# Patient Record
Sex: Female | Born: 1960 | Race: Black or African American | Hispanic: No | Marital: Single | Smoking: Former smoker
Health system: Southern US, Community
[De-identification: ages and names within clinical notes are randomized; demographics above are authoritative.]

## PROBLEM LIST (undated history)

## (undated) HISTORY — PX: ABDOMINAL HYSTERECTOMY: SHX81

---

## 1998-04-13 ENCOUNTER — Emergency Department (HOSPITAL_COMMUNITY): Admission: EM | Admit: 1998-04-13 | Discharge: 1998-04-13 | Payer: Self-pay | Admitting: Emergency Medicine

## 1998-08-31 ENCOUNTER — Ambulatory Visit (HOSPITAL_BASED_OUTPATIENT_CLINIC_OR_DEPARTMENT_OTHER): Admission: RE | Admit: 1998-08-31 | Discharge: 1998-08-31 | Payer: Self-pay | Admitting: Orthopedic Surgery

## 1998-11-10 ENCOUNTER — Emergency Department (HOSPITAL_COMMUNITY): Admission: EM | Admit: 1998-11-10 | Discharge: 1998-11-10 | Payer: Self-pay | Admitting: Emergency Medicine

## 1998-11-10 ENCOUNTER — Emergency Department (HOSPITAL_COMMUNITY): Admission: EM | Admit: 1998-11-10 | Discharge: 1998-11-11 | Payer: Self-pay | Admitting: Internal Medicine

## 1998-11-11 ENCOUNTER — Encounter: Payer: Self-pay | Admitting: Internal Medicine

## 1998-11-14 ENCOUNTER — Emergency Department (HOSPITAL_COMMUNITY): Admission: EM | Admit: 1998-11-14 | Discharge: 1998-11-14 | Payer: Self-pay | Admitting: Emergency Medicine

## 1999-05-08 ENCOUNTER — Encounter: Admission: RE | Admit: 1999-05-08 | Discharge: 1999-05-08 | Payer: Self-pay | Admitting: Family Medicine

## 1999-11-06 ENCOUNTER — Encounter: Payer: Self-pay | Admitting: Family Medicine

## 1999-11-06 ENCOUNTER — Encounter: Admission: RE | Admit: 1999-11-06 | Discharge: 1999-11-06 | Payer: Self-pay | Admitting: Family Medicine

## 2001-05-22 ENCOUNTER — Other Ambulatory Visit: Admission: RE | Admit: 2001-05-22 | Discharge: 2001-05-22 | Payer: Self-pay | Admitting: Obstetrics and Gynecology

## 2002-09-17 ENCOUNTER — Other Ambulatory Visit: Admission: RE | Admit: 2002-09-17 | Discharge: 2002-09-17 | Payer: Self-pay | Admitting: Family Medicine

## 2002-09-22 ENCOUNTER — Encounter: Admission: RE | Admit: 2002-09-22 | Discharge: 2002-09-22 | Payer: Self-pay | Admitting: Family Medicine

## 2002-09-22 ENCOUNTER — Encounter: Payer: Self-pay | Admitting: Family Medicine

## 2003-05-14 ENCOUNTER — Ambulatory Visit (HOSPITAL_BASED_OUTPATIENT_CLINIC_OR_DEPARTMENT_OTHER): Admission: RE | Admit: 2003-05-14 | Discharge: 2003-05-14 | Payer: Self-pay | Admitting: Orthopedic Surgery

## 2003-05-14 ENCOUNTER — Ambulatory Visit (HOSPITAL_COMMUNITY): Admission: RE | Admit: 2003-05-14 | Discharge: 2003-05-14 | Payer: Self-pay | Admitting: Orthopedic Surgery

## 2003-09-20 ENCOUNTER — Other Ambulatory Visit: Admission: RE | Admit: 2003-09-20 | Discharge: 2003-09-20 | Payer: Self-pay | Admitting: Family Medicine

## 2003-10-12 ENCOUNTER — Encounter: Admission: RE | Admit: 2003-10-12 | Discharge: 2003-10-12 | Payer: Self-pay | Admitting: Family Medicine

## 2004-05-11 ENCOUNTER — Encounter (INDEPENDENT_AMBULATORY_CARE_PROVIDER_SITE_OTHER): Payer: Self-pay | Admitting: Specialist

## 2004-05-11 ENCOUNTER — Observation Stay (HOSPITAL_COMMUNITY): Admission: RE | Admit: 2004-05-11 | Discharge: 2004-05-12 | Payer: Self-pay | Admitting: Obstetrics and Gynecology

## 2004-09-06 ENCOUNTER — Encounter (INDEPENDENT_AMBULATORY_CARE_PROVIDER_SITE_OTHER): Payer: Self-pay | Admitting: Specialist

## 2004-09-06 ENCOUNTER — Ambulatory Visit (HOSPITAL_BASED_OUTPATIENT_CLINIC_OR_DEPARTMENT_OTHER): Admission: RE | Admit: 2004-09-06 | Discharge: 2004-09-06 | Payer: Self-pay | Admitting: Orthopedic Surgery

## 2004-09-06 ENCOUNTER — Ambulatory Visit (HOSPITAL_COMMUNITY): Admission: RE | Admit: 2004-09-06 | Discharge: 2004-09-06 | Payer: Self-pay | Admitting: Orthopedic Surgery

## 2004-11-24 ENCOUNTER — Encounter: Admission: RE | Admit: 2004-11-24 | Discharge: 2004-11-24 | Payer: Self-pay | Admitting: Family Medicine

## 2005-05-31 ENCOUNTER — Other Ambulatory Visit: Admission: RE | Admit: 2005-05-31 | Discharge: 2005-05-31 | Payer: Self-pay | Admitting: Obstetrics and Gynecology

## 2005-12-03 ENCOUNTER — Encounter: Admission: RE | Admit: 2005-12-03 | Discharge: 2005-12-03 | Payer: Self-pay | Admitting: Family Medicine

## 2006-12-13 ENCOUNTER — Encounter: Admission: RE | Admit: 2006-12-13 | Discharge: 2006-12-13 | Payer: Self-pay | Admitting: Family Medicine

## 2007-01-07 ENCOUNTER — Emergency Department (HOSPITAL_COMMUNITY): Admission: EM | Admit: 2007-01-07 | Discharge: 2007-01-07 | Payer: Self-pay | Admitting: Emergency Medicine

## 2007-12-02 ENCOUNTER — Encounter: Admission: RE | Admit: 2007-12-02 | Discharge: 2007-12-02 | Payer: Self-pay | Admitting: Orthopedic Surgery

## 2008-01-29 ENCOUNTER — Ambulatory Visit (HOSPITAL_BASED_OUTPATIENT_CLINIC_OR_DEPARTMENT_OTHER): Admission: RE | Admit: 2008-01-29 | Discharge: 2008-01-29 | Payer: Self-pay | Admitting: Orthopedic Surgery

## 2008-03-04 ENCOUNTER — Ambulatory Visit (HOSPITAL_BASED_OUTPATIENT_CLINIC_OR_DEPARTMENT_OTHER): Admission: RE | Admit: 2008-03-04 | Discharge: 2008-03-04 | Payer: Self-pay | Admitting: Orthopedic Surgery

## 2008-03-04 ENCOUNTER — Encounter (INDEPENDENT_AMBULATORY_CARE_PROVIDER_SITE_OTHER): Payer: Self-pay | Admitting: Orthopedic Surgery

## 2010-08-13 ENCOUNTER — Encounter: Payer: Self-pay | Admitting: Family Medicine

## 2010-12-05 NOTE — Op Note (Signed)
NAMEMACIEL, [name]               ACCOUNT NO.:  [id number]   MEDICAL RECORD NO.:  [id number]          PATIENT TYPE:  AMB   LOCATION:  DSC                          FACILITY:  [facility]   PHYSICIAN:  Cindee Salt, M.D.       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   OPERATION:  Proximal row carpectomy, right wrist, with radial  styloidectomy.   SURGEON:  Cindee Salt, MD   ASSISTANT:  Carolyne Fiscal, RN   ANESTHESIA:  General.   DATE OF OPERATION:  March 04, 2008.   HISTORY:  The patient is a 50 year old female with a history of wrist  pain.  This has not responded to conservative treatment.  She has  undergone arthroscopic inspection with identification of both  scapholunate and lunotriquetral ligament injuries with instability  patterns to each.  Various treatment alternatives have been discussed  with the patient including attempt at reconstructions, partial wrist  fusion, proximal row carpectomy, observation, anti-inflammatories, and  splinting.  She is desirous of proceeding with the proximal row  carpectomy, being well aware that a wrist fusion may be necessary at  some point in the future.  She has had questions encouraged and answered  to her satisfaction and has decided to proceed.  In the preoperative  area, the patient is seen.  The extremity marked by both the patient and  surgeon.  Antibiotic given.   PROCEDURE:  The patient is brought to the operating room where a general  anesthetic was carried out under the direction of Dr. Katrinka Blazing.  She was  prepped using DuraPrep, supine position, with the right arm free.  A  time-out was taken.  A longitudinal incision was made at the dorsal  aspect of the wrist, carried down through subcutaneous tissue.  The  joint was entered in the 3/4 interval.  The retractors were placed.  A T  incision was made in the dorsal capsule ligament, carried down into the  joint.  The capitate showed no changes.  The instability patterns of  scapholunate and lunotriquetral ligament bones were immediately  apparent.  The bones were then isolated.  The scaphoid was then removed  with blunt and sharp dissection, preserving the radioscaphocapitate  ligament.  The triquetrum was removed next.  Again, this was isolated  with blunt and sharp dissection using Beaver blades including a 2700  blade.  This was removed in toto.  The lunate was removed finally.  This  was done after isolation of the bone.  A bone clamp was placed.  With  blunt and sharp dissection, the volar ligament attachments to the lunate  were stripped off subperiosteally maintaining these to the capitate.  The capitate immediately displaced proximally into the lunate facet  which did not show any articular surface damage.  The radial styloid was  noted to hit onto the  trapezium.  This was then isolated with sharp  dissection and removed with a rongeur.  Radial and ulnar deviation  showed no further impingement.  The wound was irrigated.  The capsule  was closed with figure-of-eight 3-0 Mersilene sutures.  The retinaculum  repaired with figure-of-eight 4-0 Vicryl and subcutaneous tissue with 4-  0 Vicryl.  X-rays confirmed positioning of the capitate and lunate facet  in both AP and lateral directions.  Skin was closed with interrupted 4-0  Vicryl Rapide sutures.  Sterile compressive dressing and dorsal palmar  splint applied.  X-rays confirmed positioning  of the lunate facet being filled with the capitate both AP and lateral  directions.  The patient tolerated the procedure well and was taken to  the recovery room for observation in satisfactory condition.  She will  be admitted for  overnight stay for pain control.  She will be discharged  on Percocet.           ______________________________  Cindee Salt, M.D.     GK/MEDQ  D:  03/04/2008  T:  03/05/2008  Job:  [id number]

## 2010-12-05 NOTE — Op Note (Signed)
NAMETERRIE, HARING               ACCOUNT NO.:  [id number]   MEDICAL RECORD NO.:  [id number]          PATIENT TYPE:  AMB   LOCATION:  DSC                          FACILITY:  [facility]   PHYSICIAN:  Cindee Salt, M.D.       DATE OF BIRTH:  December 28, 1960   DATE OF PROCEDURE:  01/29/2008  DATE OF DISCHARGE:                               OPERATIVE REPORT   PREOPERATIVE DIAGNOSIS:  Ulnocarpal abutment, right wrist with  scapholunate ligament tear.   POSTOPERATIVE DIAGNOSES:  Ulnocarpal abutment, right wrist with  scapholunate ligament tear plus lunotriquetral ligament tear.   OPERATION:  Arthroscopy, debridement triangular fibrocartilage complex,  Feldon arthroplasty, right wrist.  It is noted that the patient has  Dorna Bloom IV scapholunate, Geissler IV lunotriquetral ligament tear with  some changes in the proximal hamate.   SURGEON:  Cindee Salt, MD   ASSISTANT:  Joaquin Courts, RN   ANESTHESIA:  General.   ANESTHESIOLOGIST:  Bedelia Person, MD   HISTORY:  The patient is a 50 year old female who suffered an injury to  her right wrist.  She has had MRI confirmation of an ulnocarpal abutment  with the scapholunate ligament tear of her right wrist.  This has not  responded to conservative treatment.  She has elected to undergo  arthroscopic inspection, possible shrinkage of the scapholunate-  lunotriquetral, possibility of a Feldon arthroplasty with debridement of  triangle fibrocartilage complex, right wrist.  She is aware of risks and  complications including the possibility of infection; recurrence of  injury to arteries, nerves, tendons, complete relief of symptoms; and  dystrophy.  The possibility of further procedures being necessary should  she have a significant scapholunate-lunotriquetral tear.  In the  preoperative area, the patient is seen.  Questions have been encouraged  and answered.  The extremity marked by both the patient and surgeon.  Antibiotic given.   PROCEDURE:  The patient  was brought to the operating room where general  anesthetic was carried out without difficulty under the direction of Dr.  Gypsy Balsam.  She was prepped using DuraPrep, supine position, right arm free.  A time-out was taken.  The limb was placed in the arthroscopy tower.  Ten pounds of traction applied.  The joint inflated through 3-4 portal.  A transverse incision was made, deepened with a hemostat, and blunt  trocar was used to enter the joint.  Care was taken to protect the EPL  tendon.  The scope was introduced.  The volar and radial wrist ligaments  were intact.  There were no significant changes in the radial articular  surface.  A tear of the scapholunate was noted.  The scope was able to  be introduced through the tear into the midcarpal joint visualizing the  proximal capitate.  A moderate difficulty was encountered in attempting  to get this by the rotated lunate.  An irrigation catheter was placed  using an 18-gauge needle in the 6 U portal.  A 4-5 portal was opened  after localization with 22-gauge needle, a transverse incision was made,  deepened with a hemostat, and blunt trocar was used to  enter the joint.  Blunt trocar was then used to push the lunate out of the way, and the  scope was introduced in the ulnar aspect of the wrist.  A central tear  of the triangular fibrocartilage complex was noted with a circular  deformity.  A shaver was introduced.  A debridement was then performed,  and significant changes were present on the ulnar head.  A probe was  introduced and a tear of the lunotriquetral joint was noted.  The scope  was then introduced in the 4-5 portal.  The lunotriquetral joint was  inspected, and a complete tear of lunotriquetral joint with the ability  to place the scope into the midcarpal joint through the lunotriquetral  tear was able to be performed.  The midcarpal joint was then inspected  after localization with a 22-gauge needle.  The ulnar midcarpal portal   was opened.  The scope introduced after widening this with a hemostat  and blunt trocar.  The proximal capitate showed no significant changes.  Mild changes were present on the hamate, but did not require  debridement.  The instability of the scapholunate-lunotriquetral joint  was immediately apparent.  No further lesions were noted.  The scope was  then removed, introduced into the 4-5 portal.  The cartilage from the  distal ulna was then removed with a full-radius shaver.  A rotary bur  was then introduced, and a Feldon arthroplasty performed.  X-rays  confirmed adequate decompression of the ulnar head.  No further remnants  remained.  The instruments were removed.  The portals were closed with  interrupted 5-0 Vicryl Rapide sutures.  Sterile compressive dressing and  a wrist splint was applied.  The patient tolerated the procedure well,  and was taken to the recovery room for observation in satisfactory  condition.  She will be discharged home to return to the [facility]  [place] in 1 week.  She will be discharged on Vicodin.           ______________________________  Cindee Salt, M.D.     GK/MEDQ  D:  01/29/2008  T:  01/29/2008  Job:  [id number]

## 2010-12-08 NOTE — H&P (Signed)
NAMEVANESSIA, [name]               ACCOUNT NO.:  [id number]   MEDICAL RECORD NO.:  [id number]          PATIENT TYPE:  AMB   LOCATION:  SDC                           [facility]   PHYSICIAN:  Dineen Kid. Rana Snare, M.D.    DATE OF BIRTH:  1961-02-22   DATE OF ADMISSION:  05/11/2004  DATE OF DISCHARGE:                                HISTORY & PHYSICAL   HISTORY OF PRESENT ILLNESS:  Ms. [name] is a 50 year old G2 P2 with  worsening pelvic pain, menorrhagia, and history of fibroids.  She has had a  known history of fibroids for the last number of years but in the last 6  months to a year she hurts approximately 1 week before menstrual cycle until  about 1 week after menstrual cycle which is socially and physically  incapacitating for her.  Her menstrual flow is extremely heavy.  She has  been worked up for iron-deficiency anemia currently on iron just to maintain  a hemoglobin within the normal range.  The latest hemoglobin is 13.6.  She  does taken anti-inflammatory medications for her menstrual cycle as well as  Vicodin for the pain.   PHYSICAL EXAMINATION:  VITALS:  Her blood pressure is 100/68, weight is 165.  HEART:  Regular rate and rhythm.  LUNGS:  Clear to auscultation bilaterally.  ABDOMEN:  Nondistended, nontender.  PELVIC:  Uterus is 12 weeks size, globular, mild to moderate descensus of  the uterus, no adnexal masses are palpable.  Cervix within normal limits.  No appreciable cystocele or rectocele is noted.   PAST MEDICAL HISTORY:  Hypertension.   PAST SURGICAL HISTORY:  She has had a tubal ligation and a cesarean section  with her second delivery; first delivery was vaginal.   MEDICATIONS:  She is on blood pressure medicine (she is unsure of the name),  she is on iron, she is on vitamins, she is on anti-inflammatory medications  and Vicodin as needed.   ALLERGIES:  She has no known drug allergies.   IMPRESSION AND PLAN:  Twelve weeks size fibroids, menorrhagia,  dysmenorrhea,  and pelvic pain.  Discussed different options with the patient.  Recommendation at this point is hysterectomy and she would like to proceed  with this.  Discussed different options such as abdominal verus laparoscopic  assisted vaginal hysterectomy.  She understands that because of the size of  the uterus this may not be possible to perform it vaginally.  We will plan  to proceed with a laparoscopic assisted vaginal hysterectomy knowing that we  may need to switch to a total abdominal hysterectomy.  We will try to  preserve both of the ovaries.  The risks and benefits of the procedure were  discussed at length which include but are not limited to risk of infection,  bleeding, damage to bowel, bladder, ureters, ovaries,  risks associated with anesthesia, risks associated with blood loss including  risks associated with blood transfusion including but not limited to risk of  hepatitis or acquired immunodeficiency syndrome.  She does give her informed  consent and wishes to proceed.  DCL/MEDQ  D:  05/08/2004  T:  05/08/2004  Job:  [id number]

## 2010-12-08 NOTE — Op Note (Signed)
[name], [name]               ACCOUNT NO.:  [id number]   MEDICAL RECORD NO.:  [id number]          PATIENT TYPE:  OBV   LOCATION:  9399                          FACILITY:  [facility]   PHYSICIAN:  Dineen Kid. Rana Snare, M.D.    DATE OF BIRTH:  1961-06-19   DATE OF PROCEDURE:  05/11/2004  DATE OF DISCHARGE:                                 OPERATIVE REPORT   PREOPERATIVE DIAGNOSES:  1.  12 week size fibroid uterus.  2.  Menometrorrhagia.  3.  Dysmenorrhea.  4.  Pelvic pain.   POSTOPERATIVE DIAGNOSES:  1.  12 week size fibroid uterus.  2.  Menometrorrhagia.  3.  Dysmenorrhea.  4.  Pelvic pain.  5.  Pelvic adhesions.  6.  290 g uterus.   SURGEON:  Dineen Kid. Rana Snare, M.D.   ASSISTANT:  Duke Salvia. Marcelle Overlie, M.D.   PROCEDURE:  Laparoscopically assisted vaginal hysterectomy with right  salpingectomy and lysis of adhesions.   ANESTHESIA:  General endotracheal.   INDICATIONS FOR PROCEDURE:  [name] is a 50 year old, G2, P2 with  worsening pelvic pain, menometrorrhagia and history of fibroids.  She had a  known history of fibroids for a number of years but in the last six months  she hurts for at least one week before her menstrual cycle to about a week  after her menstrual cycle which is socially and physically incapacitating  for menstrual flow was extremely heavy and sometimes irregular. She is  worked up for iron deficiency anemia and currently on iron to maintain a  hemoglobin within the normal range now requiring antiinflammatory  medications as well as Vicodin for the pain. She presents for probable  laparoscopically assisted vaginal hysterectomy, possible total abdominal  hysterectomy. The risks and benefits were discussed at length and informed  consent was obtained.  See history and physical for further details.   FINDINGS:  A 12 week size fibroid uterus, pelvic adhesions the left and  right adnexa and from the omentum to the anterior abdominal wall from  previous cesarean section,  normal appearing ovaries, the right fallopian  tube was markedly devascularized from the adhesions after the surgery and  therefore removed.  Estimated blood loss 350 mL.   DESCRIPTION OF PROCEDURE:  After adequate analgesia, the patient was placed  in the dorsal lithotomy position, she was sterilely prepped and draped, the  bladder was sterilely drained. A Hulka tenaculum was placed in the anterior  lip of the cervix, a 1 cm infraumbilical skin incision was made, a Veress  needle was inserted, abdomen insufflated, dullness to percussion. An 11 mm  trocar was inserted, the laparoscope was then inserted and the above  findings were noted. A 5 mm trocar was inserted through the lip at the  midline two fingerbreadths above the pubic symphysis under direct  visualization.  A gyrus tripolar LigaSure was used to coagulate and dissect  the omental adhesions from the anterior abdominal wall so visualization  could occur of the pelvis. Adhesions from the omentum and the left pelvic  sidewall were also dissected from the left tube and ovary and similarly on  the right tube and ovary.  The round ligament was identified on the left,  coagulated and dissected. The uterine ovarian ligament was also down across  the round ligament along the left side with the ovary and distal portion of  the fallopian tube falling away from the uterus.  The uterosacral ligament  was dissected with the tripolar LigaSure and then across the round ligaments  with the ovary and fallopian tube falling away from the uterus. The bladder  was elevated, dissected from the uterine vesical junction with bladder flap  created. The abdomen was then desufflated, legs repositioned, weighted  speculum was placed in the vagina. A posterior colpotomy was performed, the  cervix was circumscribed with Bovie cautery and uterosacral ligaments were  identified, clamped and coagulated with a LigaSure ligator.  The Mayo  scissors were used to  dissect across the uterosacral ligaments and then  across the cardinal ligaments. The bladder was then dissected off the  anterior surface of the cervix and entrance to the anterior peritoneum. The  Deaver retractor was placed just below the retractor. Successive bites using  the LigaSure were made, Mayo scissors to dissect along the inferior portion  of the broad ligaments, good hemostasis achieved. The fundus was then  grasped, delivered into the introitus and remaining portion of the uterus  was removed.  The uterus weighed 290 g. At this point, the bowel was packed  with a __________, the uterosacral ligaments were identified and suture  ligated with a #0 Monocryl suture, posterior peritoneum was closed in a  pursestring fashion with #0 Monocryl suture. The posterior vaginal mucosa  was then closed in a vertical fashion using figure-of-eights of #0 Monocryl  suture with good approximation and good support noted in the posterior  vagina.  The packing was then removed, anterior vaginal mucosa was closed in  vertical fashion using #0 Monocryl suture in figure-of-eight fashion and the  vagina was closed with good approximation and good hemostasis.  A Foley  catheter was placed with return of clear yellow urine.  The abdomen was  reinsufflated, legs repositioned.  Suction irrigation was used to clean the  pelvis and cul-de-sac, hemostasis achieved mostly with peritoneal bleeders  along the bladder flap made hemostatic with bipolar cautery.  The right  distal portion of the fallopian tube appeared to be necrotic after it was  ligated with the gyrus tripolar LigaSure and easily removed.  At this point,  the right ovary and left ovary and the distal portion of the left fallopian  tube remained normal in appearance. Hemostasis appeared to be normal. The  abdomen was then desufflated, trocar was removed. The infraumbilical skin incision was closed with figure-of-eight of #0 Vicryl. 3-0 Vicryl  Rapide was  used to close the umbilical incision in subcuticular fashion.  The 5 mm site  was closed with interrupted suture of 3-0 Vicryl Rapide. The incisions were  then injected with 0.25% Marcaine, 10 mL total used. The patient was stable  on transfer to the recovery room. Sponge and needle count was normal x3. The  patient received 1 g of Rocephin preoperatively. Estimated blood loss was  350 mL.  Again the uterus weighed 290 g. Sponge and instrument was normal  x3.      DCL/MEDQ  D:  05/11/2004  T:  05/11/2004  Job:  [id number]

## 2010-12-08 NOTE — Op Note (Signed)
   [name], [name]                           ACCOUNT NO.:  [id number]   MEDICAL RECORD NO.:  [id number]                   PATIENT TYPE:  AMB   LOCATION:  DSC                                  FACILITY:  [facility]   PHYSICIAN:  Cindee Salt, M.D.                    DATE OF BIRTH:  [birth year]   DATE OF PROCEDURE:  05/14/2003  DATE OF DISCHARGE:                                 OPERATIVE REPORT   PREOPERATIVE DIAGNOSIS:  Carpal tunnel syndrome right hand.   POSTOPERATIVE DIAGNOSIS:  Carpal tunnel syndrome right hand.   OPERATION:  Decompression right median nerve.   SURGEON:  Cindee Salt, M.D.   ASSISTANTCarolyne Fiscal.   ANESTHESIA:  Forearm based IV regional.   INDICATIONS FOR PROCEDURE:  The patient is a 50 year old female with a  history of carpal tunnel syndrome, EMG nerve conductions positive which has  not responded to conservative treatment.   DESCRIPTION OF PROCEDURE:  The patient is brought to the operating room  where a forearm based IV regional anesthetic was carried out without  difficulty. She was prepped using DuraPrep in supine position with right arm  free.  A longitudinal incision was made in the skin and carried down through  subcutaneous tissue.  Bleeders were electrocauterized.  Palmar fascia was  split.  Superficial palmar arch identified.  The flexor tendon to the little  finger identified to the ulnar side of the median nerve.  The carpal  retinaculum was incised with sharp dissection.  A right angle and Sewell  retractor were placed between skin and forearm fascia.  The fascia was  released for approximately 3 cm proximal to the wrist crease under direct  vision.  Canal was explored.  No further lesions were identified.  The wound  was irrigated.  The skin was closed with interrupted 5-0 nylon sutures.  Tenosynovial tissue was moderately thickened.  The area of pressure on the  nerve was identified.  A sterile compressive dressing and splint was  applied.  The  patient tolerated the procedure well and was taken to the  recovery room for observation in satisfactory condition.  The patient  is  discharged home to return to the [facility] in one week on  Vicodin and Keflex.                                               Cindee Salt, M.D.    Angelique Blonder  D:  05/14/2003  T:  05/14/2003  Job:  [id number]

## 2010-12-08 NOTE — Discharge Summary (Signed)
NAMELETONYA, MANGELS               ACCOUNT NO.:  [id number]   MEDICAL RECORD NO.:  [id number]          PATIENT TYPE:  OBV   LOCATION:  9313                          [facility]ACILITY[facility]   PHYSICIAN:  Dineen Kid. Rana Snare, M.D.    DATE OF BIRTH:  [birth year]   DATE OF ADMISSION:  05/11/2004  DATE OF DISCHARGE:                                 DISCHARGE SUMMARY   HISTORY OF PRESENT ILLNESS:  Ms. Freeburg is a 50 year old G2 P2 with  worsening pelvic pain, menometrorrhagia, and history of fibroids.  She has  had a known history of fibroids for a number of years, but in the last 6  months she has been hurting for at least 2 weeks out of her monthly cycle  which is socially and physically incapacitating for her.  She presents for  definitive surgical intervention, planned laparoscopic-assisted vaginal  hysterectomy.  Risks and benefits were discussed and informed consent was  obtained.   HOSPITAL COURSE:  The patient underwent a laparoscopic-assisted vaginal  hysterectomy which was complicated only by pelvic adhesions and requiring a  right salpingectomy.  The uterus was 290 g consistent with a 12-week size  fibroid.  Her surgery was uncomplicated.  She did lose 350 mL blood at the  time of surgery.  Her postoperative course was unremarkable with good return  of bowel function, ambulation, and by postoperative day #1 she had remained  afebrile with a hemoglobin of 11.1 on postoperative day #1 and white count  of 21.9.  She was given a gram of Rocephin on May 12, 2004 because of  the elevated white count despite being afebrile, and because she was  tolerating a regular diet, ambulating well, and passing flatus, she was  discharged home on Percocet with follow-up in the office in 1-2 weeks.  Told  to return for increased pain, fever, or bleeding.      DCL/MEDQ  D:  05/12/2004  T:  05/12/2004  Job:  [id number]

## 2010-12-08 NOTE — Op Note (Signed)
NAMEJERI, [name]               ACCOUNT NO.:  [id number]   MEDICAL RECORD NO.:  [id number]          PATIENT TYPE:  AMB   LOCATION:  DSC                          FACILITY:  [facility]   PHYSICIAN:  Cindee Salt, M.D.       DATE OF BIRTH:  01/23/1961   DATE OF PROCEDURE:  09/06/2004  DATE OF DISCHARGE:                                 OPERATIVE REPORT   PREOPERATIVE DIAGNOSIS:  Stenosing tenosynovitis of right thumb.   POSTOPERATIVE DIAGNOSIS:  1.  Stenosing tenosynovitis of right thumb.  2.  Flexor sheath cyst.   OPERATION:  Release A1 pulley right thumb, excision flexor sheath cyst A1  pulley.   SURGEON:  Cindee Salt, M.D.   ASSISTANTCarolyne Fiscal   ANESTHESIA:  Forearm based IV regional.   HISTORY:  The patient is a 50 year old female with a history of a stenosing  tenosynovitis triggering of her right thumb that has not responded to  conservative treatment.   PROCEDURE:  The patient is brought to the operating room where a forearm  based IV regional anesthetic was carried out without difficulty. She was  prepped using DuraPrep, supine position, right arm free. A transverse  incision was made over the A1 pulley of the right thumb, carried down  through subcutaneous tissue. Neurovascular structures were identified and  protected with retractors. The A1 pulley was immediately identified.  The  cyst was present to the radial aspect on the radial side. The A1 pulley was  released, protecting the oblique pulley. The cyst was excised, sent to  pathology.  Thumb placed through full range motion, no triggering was  identified. The wound was irrigated. Skin was closed interrupted 5-0 nylon  sutures. Sterile compressive dressing was applied. The patient tolerated the  procedure well was taken to the recovery observation in satisfactory  condition. She is discharged home to return to [facility]  in one week on Vicodin.      GK/MEDQ  D:  09/06/2004  T:  09/06/2004  Job:   [id number]

## 2011-04-19 LAB — BASIC METABOLIC PANEL
Creatinine, Ser: 1.11
GFR calc non Af Amer: 53 — ABNORMAL LOW
Glucose, Bld: 98
Sodium: 134 — ABNORMAL LOW

## 2011-04-19 LAB — POCT HEMOGLOBIN-HEMACUE: Hemoglobin: 14.4

## 2011-04-20 LAB — BASIC METABOLIC PANEL
BUN: 11
Calcium: 9.9
Creatinine, Ser: 1.1
GFR calc Af Amer: >60

## 2011-04-20 LAB — POCT HEMOGLOBIN-HEMACUE: Hemoglobin: 15.4 — ABNORMAL HIGH

## 2011-05-09 LAB — URINALYSIS, ROUTINE W REFLEX MICROSCOPIC
Ketones, ur: NEGATIVE
Protein, ur: 100 — AB
Specific Gravity, Urine: 1.018
pH: 6.5

## 2011-05-09 LAB — DIFFERENTIAL
Basophils Absolute: 0.1
Basophils Relative: 1
Eosinophils Absolute: 0.4
Eosinophils Relative: 4
Lymphocytes Relative: 31
Lymphs Abs: 3
Monocytes Absolute: 0.8 — ABNORMAL HIGH
Monocytes Relative: 8
Neutro Abs: 5.5
Neutrophils Relative %: 56

## 2011-05-09 LAB — I-STAT 8, (EC8 V) (CONVERTED LAB)
Acid-Base Excess: 1
BUN: 7
HCT: 46
Operator id: 285841
Potassium: 3.4 — ABNORMAL LOW
TCO2: 29
pH, Ven: 7.36 — ABNORMAL HIGH

## 2011-05-09 LAB — CBC
HCT: 42.3
Hemoglobin: 14.2
MCHC: 33.5
MCV: 88.2
Platelets: 226
RBC: 4.8
RDW: 14.2 — ABNORMAL HIGH
WBC: 9.8

## 2011-05-09 LAB — POCT I-STAT CREATININE: Creatinine, Ser: 1.1

## 2011-05-09 LAB — URINE MICROSCOPIC-ADD ON

## 2014-04-06 ENCOUNTER — Other Ambulatory Visit: Payer: Self-pay | Admitting: Gastroenterology

## 2015-03-15 ENCOUNTER — Ambulatory Visit (INDEPENDENT_AMBULATORY_CARE_PROVIDER_SITE_OTHER): Payer: BLUE CROSS/BLUE SHIELD | Admitting: Physician Assistant

## 2015-03-15 VITALS — BP 147/87 | HR 53 | Temp 98.3°F | Resp 16 | Ht 64.5 in | Wt 179.0 lb

## 2015-03-15 DIAGNOSIS — R070 Pain in throat: Secondary | ICD-10-CM

## 2015-03-15 LAB — POCT RAPID STREP A (OFFICE): Rapid Strep A Screen: NEGATIVE

## 2015-03-15 NOTE — Patient Instructions (Signed)
Continue to hydrate well. You can use the cepacol throat lozenges and throat pain. Use tylenol or ibuprofen/naproxen, etc.,  for the pain. Gargle with warm salt water.    Pharyngitis Pharyngitis is redness, pain, and swelling (inflammation) of your pharynx.  CAUSES  Pharyngitis is usually caused by infection. Most of the time, these infections are from viruses (viral) and are part of a cold. However, sometimes pharyngitis is caused by bacteria (bacterial). Pharyngitis can also be caused by allergies. Viral pharyngitis may be spread from person to person by coughing, sneezing, and personal items or utensils (cups, forks, spoons, toothbrushes). Bacterial pharyngitis may be spread from person to person by more intimate contact, such as kissing.  SIGNS AND SYMPTOMS  Symptoms of pharyngitis include:   Sore throat.   Tiredness (fatigue).   Low-grade fever.   Headache.  Joint pain and muscle aches.  Skin rashes.  Swollen lymph nodes.  Plaque-like film on throat or tonsils (often seen with bacterial pharyngitis). DIAGNOSIS  Your health care provider will ask you questions about your illness and your symptoms. Your medical history, along with a physical exam, is often all that is needed to diagnose pharyngitis. Sometimes, a rapid strep test is done. Other lab tests may also be done, depending on the suspected cause.  TREATMENT  Viral pharyngitis will usually get better in 3-4 days without the use of medicine. Bacterial pharyngitis is treated with medicines that kill germs (antibiotics).  HOME CARE INSTRUCTIONS   Drink enough water and fluids to keep your urine clear or pale yellow.   Only take over-the-counter or prescription medicines as directed by your health care provider:   If you are prescribed antibiotics, make sure you finish them even if you start to feel better.   Do not take aspirin.   Get lots of rest.   Gargle with 8 oz of salt water ( tsp of salt per 1 qt of  water) as often as every 1-2 hours to soothe your throat.   Throat lozenges (if you are not at risk for choking) or sprays may be used to soothe your throat. SEEK MEDICAL CARE IF:   You have large, tender lumps in your neck.  You have a rash.  You cough up green, yellow-brown, or bloody spit. SEEK IMMEDIATE MEDICAL CARE IF:   Your neck becomes stiff.  You drool or are unable to swallow liquids.  You vomit or are unable to keep medicines or liquids down.  You have severe pain that does not go away with the use of recommended medicines.  You have trouble breathing (not caused by a stuffy nose). MAKE SURE YOU:   Understand these instructions.  Will watch your condition.  Will get help right away if you are not doing well or get worse. Document Released: 07/09/2005 Document Revised: 04/29/2013 Document Reviewed: 03/16/2013 [facility] Patient Information 2015 [place], [place]. This information is not intended to replace advice given to you by your health care provider. Make sure you discuss any questions you have with your health care provider.

## 2015-03-15 NOTE — Progress Notes (Signed)
[facility] [address], [place] [address] [phone]  Date:  03/15/2015   Name:  [name]   DOB:  Feb 04, 1961   MRN:  [id number]  PCP:  No primary care provider on file.    History of Present Illness:  [name] is a 54 y.o. female patient who presents to [facility] for chief complaint of throat pain.  This started yesterday.  Hurts to swallow.  No sob, or dyspnea.  No fever, but hurts along the neck.  She denies cough, congestion, ear discomfort or allergies.  No sick contacts.    There are no active problems to display for this patient.   History reviewed. No pertinent past medical history.  History reviewed. No pertinent past surgical history.  Social History  Substance Use Topics  . Smoking status: Former Games developer  . Smokeless tobacco: None  . Alcohol Use: No    History reviewed. No pertinent family history.  No Known Allergies  Medication list has been reviewed and updated.  No current outpatient prescriptions on file prior to visit.   No current facility-administered medications on file prior to visit.    ROS ROS otherwise unremarkable unless listed above.   Physical Examination: BP 147/87 mmHg  Pulse 53  Temp(Src) 98.3 F (36.8 C) (Oral)  Resp 16  Ht 5' 4.5" (1.638 m)  Wt 179 lb (81.194 kg)  BMI 30.26 kg/m2  SpO2 99% Ideal Body Weight: Weight in (lb) to have BMI = 25: 147.6  Physical Exam  Constitutional: She appears well-developed and well-nourished. No distress.  HENT:  Nose: No mucosal edema or rhinorrhea.  Mouth/Throat: No uvula swelling. Posterior oropharyngeal edema (2+) and posterior oropharyngeal erythema present. No oropharyngeal exudate.  Eyes: Pupils are equal, round, and reactive to light.  Cardiovascular: Normal rate, regular rhythm and normal heart sounds.  Exam reveals no gallop and no distant heart sounds.   No murmur heard. Pulmonary/Chest: No respiratory distress. She has no decreased breath  sounds. She has no wheezes. She has no rhonchi.  Skin: She is not diaphoretic.  Psychiatric: She has a normal mood and affect. Her behavior is normal.     Assessment and Plan: 54 year old female is here today for chief complaint of sore throat.  Treating supportively as viral pharyngitis.  Advised to gargle.   Strep culture sent Tylenol/ibuprofen for pain.  Throat pain - Plan: POCT rapid strep A, Culture, Group A Strep   Trena Platt, PA-C [facility] 03/15/2015 10:18 AM

## 2015-03-16 LAB — CULTURE, GROUP A STREP: Organism ID, Bacteria: NORMAL

## 2015-04-18 ENCOUNTER — Other Ambulatory Visit: Payer: Self-pay | Admitting: Obstetrics and Gynecology

## 2015-04-18 DIAGNOSIS — R928 Other abnormal and inconclusive findings on diagnostic imaging of breast: Secondary | ICD-10-CM

## 2015-05-03 ENCOUNTER — Ambulatory Visit
Admission: RE | Admit: 2015-05-03 | Discharge: 2015-05-03 | Disposition: A | Discharge status: Home / Self Care | Payer: BLUE CROSS/BLUE SHIELD | Source: Ambulatory Visit | Attending: Obstetrics and Gynecology | Admitting: Obstetrics and Gynecology

## 2015-05-03 DIAGNOSIS — R928 Other abnormal and inconclusive findings on diagnostic imaging of breast: Secondary | ICD-10-CM

## 2015-07-04 ENCOUNTER — Ambulatory Visit (INDEPENDENT_AMBULATORY_CARE_PROVIDER_SITE_OTHER): Payer: BLUE CROSS/BLUE SHIELD | Admitting: Family Medicine

## 2015-07-04 VITALS — BP 136/82 | HR 61 | Temp 98.4°F | Resp 18 | Ht 64.5 in | Wt 176.0 lb

## 2015-07-04 DIAGNOSIS — H6122 Impacted cerumen, left ear: Secondary | ICD-10-CM

## 2015-07-04 DIAGNOSIS — J209 Acute bronchitis, unspecified: Secondary | ICD-10-CM

## 2015-07-04 MED ORDER — HYDROCODONE-HOMATROPINE 5-1.5 MG/5ML PO SYRP: 5.0000 mL | ORAL_SOLUTION | Freq: Three times a day (TID) | ORAL | Status: DC | PRN

## 2015-07-04 MED ORDER — AZITHROMYCIN 250 MG PO TABS: ORAL_TABLET | ORAL | Status: DC

## 2015-07-04 NOTE — Patient Instructions (Signed)

## 2015-07-04 NOTE — Progress Notes (Signed)
This chart was scribed for Elvina Sidle, MD by Stann Ore, medical scribe at [facility].The patient was seen in exam room 4 and the patient's care was started at 12:35 PM.  Patient ID: SHARONE PICCHI MRN: [id number], DOB: 1960-11-01, 54 y.o. Date of Encounter: 07/04/2015  Primary Physician: No primary care provider on file.  Chief Complaint:  Chief Complaint  Patient presents with  . Fever    since last wednesday   . Chills  . Cough    red-yellow mucous     HPI:  JACKYE DEVER is a 54 y.o. female who [facility] complaining of gradual onset fever with chills and productive cough of red-yellow mucus that started 5 days ago. She also notes general myalgia and fatigue. She had flu shot 2.5 weeks ago.   Patient is a smoker.  She works at the [employer].   History reviewed. No pertinent past medical history.   Home Meds: Prior to Admission medications   Not on File    Allergies: No Known Allergies  Social History   Social History  . Marital Status: Single    Spouse Name: N/A  . Number of Children: N/A  . Years of Education: N/A   Occupational History  . Not on file.   Social History Main Topics  . Smoking status: Former Games developer  . Smokeless tobacco: Not on file  . Alcohol Use: No  . Drug Use: No  . Sexual Activity: Not on file   Other Topics Concern  . Not on file   Social History Narrative     Review of Systems: Constitutional: negative for night sweats, weight changes; positive for fever, chills, fatigue HEENT: negative for vision changes, hearing loss, congestion, rhinorrhea, ST, epistaxis, or sinus pressure Cardiovascular: negative for chest pain or palpitations Respiratory: negative for hemoptysis, wheezing, shortness of breath; positive for cough Abdominal: negative for abdominal pain, nausea, vomiting, diarrhea, or constipation Dermatological: negative for rash Neurologic: negative for  headache, dizziness, or syncope Musc: positive for myalgia (general)  All other systems reviewed and are otherwise negative with the exception to those above and in the HPI.  Physical Exam: Blood pressure 136/82, pulse 61, temperature 98.4 F (36.9 C), temperature source Oral, resp. rate 18, height 5' 4.5" (1.638 m), weight 176 lb (79.833 kg), SpO2 98 %., Body mass index is 29.75 kg/(m^2). General: Well developed, well nourished, in no acute distress. Head: Normocephalic, atraumatic, eyes without discharge, sclera non-icteric, nares are without discharge; throat red, left ear canal cerumen impaction, right ear 2mm blood blister on floor of canal Neck: Supple. No thyromegaly. Full ROM. No lymphadenopathy. Lungs: Clear bilaterally to auscultation without wheezes, rales, or rhonchi. Breathing is unlabored. Heart: RRR with S1 S2. No murmurs, rubs, or gallops appreciated. Msk:  Strength and tone normal for age. Extremities/Skin: Warm and dry. No clubbing or cyanosis. No edema. No rashes or suspicious lesions. Neuro: Alert and oriented X 3. Moves all extremities spontaneously. Gait is normal. CNII-XII grossly in tact. Psych:  Responds to questions appropriately with a normal affect.    ASSESSMENT AND PLAN:  54 y.o. year old female with acute bronchitis and left cerumen impaction  This chart was scribed in my presence and reviewed by me personally.    ICD-9-CM ICD-10-CM   1. Acute bronchitis, unspecified organism 466.0 J20.9 HYDROcodone-homatropine (HYCODAN) 5-1.5 MG/5ML syrup     azithromycin (ZITHROMAX) 250 MG tablet  2. Cerumen impaction, left 380.4 H61.22  By signing my name below, I, Stann Oresung-Kai Tsai, attest that this documentation has been prepared under the direction and in the presence of Elvina SidleKurt Saylee Sherrill, MD. Electronically Signed: Stann Oresung-Kai Tsai, Scribe. 07/04/2015 , 12:35 PM .  Signed, Elvina SidleKurt Alashia Brownfield, MD 07/04/2015 12:35 PM

## 2017-07-04 ENCOUNTER — Encounter (HOSPITAL_COMMUNITY): Payer: Self-pay | Admitting: *Deleted

## 2017-07-04 ENCOUNTER — Ambulatory Visit (HOSPITAL_COMMUNITY)
Admission: EM | Admit: 2017-07-04 | Discharge: 2017-07-04 | Disposition: A | Discharge status: Home / Self Care | Payer: BLUE CROSS/BLUE SHIELD | Attending: Emergency Medicine | Admitting: Emergency Medicine

## 2017-07-04 ENCOUNTER — Other Ambulatory Visit: Payer: Self-pay

## 2017-07-04 DIAGNOSIS — J069 Acute upper respiratory infection, unspecified: Secondary | ICD-10-CM | POA: Diagnosis not present

## 2017-07-04 DIAGNOSIS — B349 Viral infection, unspecified: Secondary | ICD-10-CM | POA: Diagnosis not present

## 2017-07-04 MED ORDER — ACETAMINOPHEN 325 MG PO TABS
ORAL_TABLET | ORAL | Status: AC
  Filled 2017-07-04: qty 2

## 2017-07-04 MED ORDER — ACETAMINOPHEN 325 MG PO TABS
650.0000 mg | ORAL_TABLET | Freq: Once | ORAL | Status: AC
  Administered 2017-07-04: 650 mg via ORAL

## 2017-07-04 NOTE — Discharge Instructions (Signed)
Sudafed PE 10 mg every 4 to 6 hours as needed for congestion °Allegra or Zyrtec daily as needed for drainage and runny nose. °For stronger antihistamine may take Chlor-Trimeton 2 to 4 mg every 4 to 6 hours, may cause drowsiness.S °Saline nasal spray used frequently. °Drink plenty of fluids and stay well-hydrated. °

## 2017-07-04 NOTE — ED Triage Notes (Signed)
Per pt she thinks she has a cold, per pt her body aches, coughing, nasal drainage, right ear tingling,

## 2017-07-04 NOTE — ED Provider Notes (Signed)
[facility]    CSN: [id number] Arrival date & time: 07/04/17  1002     History   Chief Complaint Chief Complaint  Patient presents with  . URI    HPI [name] is a 56 y.o. female.   56 year old female complaining of body aches, sore throat, PND, right earache and occasional cough. Denies chest pain or shortness of breath. Denies nasal congestion although she is sniffling. Do not have a flu shot this year. Denies GI or GU symptoms.      History reviewed. No pertinent past medical history.  There are no active problems to display for this patient.   History reviewed. No pertinent surgical history.  OB History    No data available       Home Medications    Prior to Admission medications   Not on File    Family History History reviewed. No pertinent family history.  Social History Social History   Tobacco Use  . Smoking status: Former Games developermoker  . Smokeless tobacco: Never Used  Substance Use Topics  . Alcohol use: No    Alcohol/week: 0.0 oz  . Drug use: No     Allergies   Patient has no known allergies.   Review of Systems Review of Systems  Constitutional: Positive for activity change and fever. Negative for appetite change, chills and fatigue.  HENT: Positive for congestion, postnasal drip, rhinorrhea and sore throat. Negative for facial swelling.   Eyes: Negative.   Respiratory: Positive for cough. Negative for shortness of breath.   Cardiovascular: Negative.   Musculoskeletal: Negative for neck pain and neck stiffness.  Skin: Negative for pallor and rash.  Neurological: Negative.   All other systems reviewed and are negative.    Physical Exam Triage Vital Signs ED Triage Vitals  Enc Vitals Group     BP      Pulse      Resp      Temp      Temp src      SpO2      Weight      Height      Head Circumference      Peak Flow      Pain Score      Pain Loc      Pain Edu?      Excl. in GC?    No data  found.  Updated Vital Signs BP (!) 157/85   Pulse 87   Temp (!) 101.6 F (38.7 C) (Oral)   SpO2 100%   Visual Acuity Right Eye Distance:   Left Eye Distance:   Bilateral Distance:    Right Eye Near:   Left Eye Near:    Bilateral Near:     Physical Exam  Constitutional: She is oriented to person, place, and time. She appears well-developed and well-nourished. No distress.  HENT:  Mouth/Throat: No oropharyngeal exudate.  Oropharynx with minor erythema. No swelling, exudate. Positive for clear PND and light cobblestoning. No exudate.  Right TM retracted and clear. Left TM mildly erythematous and retracted.  Neck: Normal range of motion. Neck supple.  Cardiovascular: Normal rate and regular rhythm.  Pulmonary/Chest: Effort normal and breath sounds normal. No respiratory distress.  Musculoskeletal: Normal range of motion. She exhibits no edema.  Lymphadenopathy:    She has no cervical adenopathy.  Neurological: She is alert and oriented to person, place, and time.  Skin: Skin is warm and dry. No rash noted.  Psychiatric: She has a normal  mood and affect.  Nursing note and vitals reviewed.    UC Treatments / Results  Labs (all labs ordered are listed, but only abnormal results are displayed) Labs Reviewed - No data to display  EKG  EKG Interpretation None       Radiology No results found.  Procedures Procedures (including critical care time)  Medications Ordered in UC Medications  acetaminophen (TYLENOL) tablet 650 mg (not administered)     Initial Impression / Assessment and Plan / UC Course  I have reviewed the triage vital signs and the nursing notes.  Pertinent labs & imaging results that were available during my care of the patient were reviewed by me and considered in my medical decision making (see chart for details).    Sudafed PE 10 mg every 4 to 6 hours as needed for congestion Allegra or Zyrtec daily as needed for drainage and runny nose. For  stronger antihistamine may take Chlor-Trimeton 2 to 4 mg every 4 to 6 hours, may cause drowsiness.S Saline nasal spray used frequently. Drink plenty of fluids and stay well-hydrated.    Final Clinical Impressions(s) / UC Diagnoses   Final diagnoses:  Viral syndrome  Viral upper respiratory tract infection    ED Discharge Orders    None       Controlled Substance Prescriptions [place] Controlled Substance Registry consulted? Not Applicable   Hayden RasmussenMabe, Brodie Correll, NP 07/04/17 1043

## 2018-03-18 ENCOUNTER — Emergency Department (HOSPITAL_COMMUNITY): Payer: No Typology Code available for payment source

## 2018-03-18 ENCOUNTER — Emergency Department (HOSPITAL_COMMUNITY)
Admission: EM | Admit: 2018-03-18 | Discharge: 2018-03-18 | Disposition: A | Discharge status: Home / Self Care | Payer: No Typology Code available for payment source | Attending: Emergency Medicine | Admitting: Emergency Medicine

## 2018-03-18 ENCOUNTER — Other Ambulatory Visit: Payer: Self-pay

## 2018-03-18 ENCOUNTER — Encounter (HOSPITAL_COMMUNITY): Payer: Self-pay

## 2018-03-18 DIAGNOSIS — M25512 Pain in left shoulder: Secondary | ICD-10-CM | POA: Diagnosis not present

## 2018-03-18 DIAGNOSIS — Z87891 Personal history of nicotine dependence: Secondary | ICD-10-CM | POA: Diagnosis not present

## 2018-03-18 DIAGNOSIS — R51 Headache: Secondary | ICD-10-CM | POA: Insufficient documentation

## 2018-03-18 DIAGNOSIS — R0789 Other chest pain: Secondary | ICD-10-CM | POA: Diagnosis not present

## 2018-03-18 DIAGNOSIS — M542 Cervicalgia: Secondary | ICD-10-CM | POA: Insufficient documentation

## 2018-03-18 DIAGNOSIS — M791 Myalgia, unspecified site: Secondary | ICD-10-CM

## 2018-03-18 MED ORDER — IBUPROFEN 400 MG PO TABS: 400.0000 mg | ORAL_TABLET | Freq: Four times a day (QID) | ORAL | 0 refills | Status: AC | PRN

## 2018-03-18 MED ORDER — ACETAMINOPHEN 325 MG PO TABS
650.0000 mg | ORAL_TABLET | Freq: Once | ORAL | Status: DC
  Filled 2018-03-18: qty 2

## 2018-03-18 MED ORDER — IBUPROFEN 800 MG PO TABS
800.0000 mg | ORAL_TABLET | Freq: Once | ORAL | Status: AC
  Administered 2018-03-18: 800 mg via ORAL
  Filled 2018-03-18: qty 1
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
## 2018-03-18 NOTE — Discharge Instructions (Signed)
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
## 2019-08-24 ENCOUNTER — Encounter (INDEPENDENT_AMBULATORY_CARE_PROVIDER_SITE_OTHER): Payer: 59 | Admitting: Ophthalmology

## 2019-08-24 ENCOUNTER — Encounter (INDEPENDENT_AMBULATORY_CARE_PROVIDER_SITE_OTHER): Payer: Self-pay

## 2020-08-31 ENCOUNTER — Other Ambulatory Visit: Payer: Self-pay

## 2020-08-31 ENCOUNTER — Ambulatory Visit (HOSPITAL_COMMUNITY)
Admission: EM | Admit: 2020-08-31 | Discharge: 2020-08-31 | Disposition: A | Discharge status: Home / Self Care | Payer: 59 | Attending: Emergency Medicine | Admitting: Emergency Medicine

## 2020-08-31 ENCOUNTER — Encounter (HOSPITAL_COMMUNITY): Payer: Self-pay

## 2020-08-31 DIAGNOSIS — Z20822 Contact with and (suspected) exposure to covid-19: Secondary | ICD-10-CM | POA: Diagnosis not present

## 2020-08-31 DIAGNOSIS — Z87891 Personal history of nicotine dependence: Secondary | ICD-10-CM | POA: Diagnosis not present

## 2020-08-31 DIAGNOSIS — J029 Acute pharyngitis, unspecified: Secondary | ICD-10-CM | POA: Diagnosis not present

## 2020-08-31 LAB — POCT RAPID STREP A, ED / UC: Streptococcus, Group A Screen (Direct): NEGATIVE

## 2020-08-31 LAB — SARS CORONAVIRUS 2 (TAT 6-24 HRS): SARS Coronavirus 2: NEGATIVE

## 2020-08-31 NOTE — ED Provider Notes (Signed)
[facility]    CSN: [id number] Arrival date & time: 08/31/20  0803      History   Chief Complaint Chief Complaint  Patient presents with   Sore Throat    HPI [name] is a 60 y.o. female.   [name] presents with complaints of sore throat which she woke with this morning. No fever, headache, body aches, cough congestion or gi symptoms. No known ill contacts. Has had strep fairly frequently in the past, although not for approximately 3 years now. Has been vaccinated and boosted for covid-19.    ROS per HPI, negative if not otherwise mentioned.      History reviewed. No pertinent past medical history.  There are no problems to display for this patient.   Past Surgical History:  Procedure Laterality Date   ABDOMINAL HYSTERECTOMY      OB History   No obstetric history on file.      Home Medications    Prior to Admission medications   Medication Sig Start Date End Date Taking? Authorizing Provider  ibuprofen (ADVIL,MOTRIN) 400 MG tablet Take 1 tablet (400 mg total) by mouth every 6 (six) hours as needed. 03/18/18   Couture, Cortni S, PA-C    Family History History reviewed. No pertinent family history.  Social History Social History   Tobacco Use   Smoking status: Former Smoker   Smokeless tobacco: Never Used  Building services engineer Use: Never used  Substance Use Topics   Alcohol use: No    Alcohol/week: 0.0 standard drinks   Drug use: No     Allergies   Patient has no known allergies.   Review of Systems Review of Systems   Physical Exam Triage Vital Signs ED Triage Vitals  Enc Vitals Group     BP 08/31/20 0820 (!) 170/78     Pulse Rate 08/31/20 0820 62     Resp 08/31/20 0820 16     Temp 08/31/20 0820 98.7 F (37.1 C)     Temp Source 08/31/20 0820 Oral     SpO2 08/31/20 0820 100 %     Weight --      Height --      Head Circumference --      Peak Flow --      Pain Score 08/31/20 0819 0     Pain Loc --       Pain Edu? --      Excl. in GC? --    No data found.  Updated Vital Signs BP (!) 170/78 (BP Location: Left Arm)    Pulse 62    Temp 98.7 F (37.1 C) (Oral)    Resp 16    SpO2 100%    Physical Exam Constitutional:      General: She is not in acute distress.    Appearance: She is well-developed.  HENT:     Mouth/Throat:     Pharynx: Posterior oropharyngeal erythema present. No pharyngeal swelling.     Tonsils: Tonsillar exudate present. 1+ on the right. 1+ on the left.  Cardiovascular:     Rate and Rhythm: Normal rate.  Pulmonary:     Effort: Pulmonary effort is normal.  Skin:    General: Skin is warm and dry.  Neurological:     Mental Status: She is alert and oriented to person, place, and time.      UC Treatments / Results  Labs (all labs ordered are listed, but only abnormal results are  displayed) Labs Reviewed  CULTURE, GROUP A STREP ([facility])  SARS CORONAVIRUS 2 (TAT 6-24 HRS)  POCT RAPID STREP A, ED / UC    EKG   Radiology No results found.  Procedures Procedures (including critical care time)  Medications Ordered in UC Medications - No data to display  Initial Impression / Assessment and Plan / UC Course  I have reviewed the triage vital signs and the nursing notes.  Pertinent labs & imaging results that were available during my care of the patient were reviewed by me and considered in my medical decision making (see chart for details).     Negative rapid strep here today with culture pending. Covid testing pending and isolation instructions provided.  Supportive cares recommended for likely viral etiology. Return precautions provided. Patient verbalized understanding and agreeable to plan.    Final Clinical Impressions(s) / UC Diagnoses   Final diagnoses:  Pharyngitis, unspecified etiology     Discharge Instructions     Negative rapid strep here today. A culture is pending to confirm this result and we would notify you if this were to come  back positive.  We will also test for covid-19 to ensure this is not the source of your symptoms.  Throat lozenges, gargles, chloraseptic spray, warm teas, popsicles etc to help with throat pain.   Tylenol and/or ibuprofen as needed for pain or fevers.   If symptoms worsen or do not improve in the next week to return to be seen or to follow up with your PCP.      ED Prescriptions    None     PDMP not reviewed this encounter.   Georgetta Haber, NP 08/31/20 1005

## 2020-08-31 NOTE — ED Triage Notes (Signed)
Pc/o sore throat that started yesterday. Pt states she has had a hx of strep. She states she does not have other sxs.

## 2020-08-31 NOTE — Discharge Instructions (Signed)
Negative rapid strep here today. A culture is pending to confirm this result and we would notify you if this were to come back positive.  We will also test for covid-19 to ensure this is not the source of your symptoms.  Throat lozenges, gargles, chloraseptic spray, warm teas, popsicles etc to help with throat pain.   Tylenol and/or ibuprofen as needed for pain or fevers.   If symptoms worsen or do not improve in the next week to return to be seen or to follow up with your PCP.

## 2020-09-01 LAB — CULTURE, GROUP A STREP (THRC)

## 2020-09-02 LAB — CULTURE, GROUP A STREP (THRC)

## 2022-06-22 DIAGNOSIS — Z419 Encounter for procedure for purposes other than remedying health state, unspecified: Secondary | ICD-10-CM | POA: Diagnosis not present

## 2022-07-02 ENCOUNTER — Emergency Department (HOSPITAL_COMMUNITY)
Admission: EM | Admit: 2022-07-02 | Discharge: 2022-07-02 | Disposition: A | Discharge status: Home / Self Care | Payer: Medicaid Other | Attending: Emergency Medicine | Admitting: Emergency Medicine

## 2022-07-02 ENCOUNTER — Encounter (HOSPITAL_COMMUNITY): Payer: Self-pay

## 2022-07-02 ENCOUNTER — Other Ambulatory Visit: Payer: Self-pay

## 2022-07-02 ENCOUNTER — Emergency Department (HOSPITAL_COMMUNITY): Payer: Medicaid Other

## 2022-07-02 DIAGNOSIS — I10 Essential (primary) hypertension: Secondary | ICD-10-CM | POA: Insufficient documentation

## 2022-07-02 DIAGNOSIS — I160 Hypertensive urgency: Secondary | ICD-10-CM

## 2022-07-02 DIAGNOSIS — R519 Headache, unspecified: Secondary | ICD-10-CM | POA: Diagnosis not present

## 2022-07-02 LAB — CBC WITH DIFFERENTIAL/PLATELET
Abs Immature Granulocytes: 0.03 10*3/uL (ref 0.00–0.07)
Basophils Absolute: 0.1 10*3/uL (ref 0.0–0.1)
Basophils Relative: 1 %
Eosinophils Absolute: 0.2 10*3/uL (ref 0.0–0.5)
Eosinophils Relative: 2 %
HCT: 44.6 % (ref 36.0–46.0)
Hemoglobin: 14.5 g/dL (ref 12.0–15.0)
Immature Granulocytes: 0 %
Lymphocytes Relative: 34 %
Lymphs Abs: 3.3 10*3/uL (ref 0.7–4.0)
MCH: 29.7 pg (ref 26.0–34.0)
MCHC: 32.5 g/dL (ref 30.0–36.0)
MCV: 91.2 fL (ref 80.0–100.0)
Monocytes Absolute: 0.6 10*3/uL (ref 0.1–1.0)
Monocytes Relative: 6 %
Neutro Abs: 5.8 10*3/uL (ref 1.7–7.7)
Neutrophils Relative %: 57 %
Platelets: 195 10*3/uL (ref 150–400)
RBC: 4.89 MIL/uL (ref 3.87–5.11)
RDW: 14 % (ref 11.5–15.5)
WBC: 9.9 10*3/uL (ref 4.0–10.5)
nRBC: 0 % (ref 0.0–0.2)

## 2022-07-02 LAB — COMPREHENSIVE METABOLIC PANEL
ALT: 15 U/L (ref 0–44)
AST: 20 U/L (ref 15–41)
Albumin: 3.9 g/dL (ref 3.5–5.0)
Alkaline Phosphatase: 75 U/L (ref 38–126)
Anion gap: 8 (ref 5–15)
BUN: 12 mg/dL (ref 8–23)
CO2: 25 mmol/L (ref 22–32)
Calcium: 9.9 mg/dL (ref 8.9–10.3)
Chloride: 105 mmol/L (ref 98–111)
Creatinine, Ser: 1.05 mg/dL — ABNORMAL HIGH (ref 0.44–1.00)
GFR, Estimated: >60 mL/min (ref >60)
Glucose, Bld: 84 mg/dL (ref 70–99)
Potassium: 3.8 mmol/L (ref 3.5–5.1)
Sodium: 138 mmol/L (ref 135–145)
Total Bilirubin: 0.7 mg/dL (ref 0.3–1.2)
Total Protein: 8 g/dL (ref 6.5–8.1)

## 2022-07-02 MED ORDER — SODIUM CHLORIDE 0.9 % IV BOLUS
1000.0000 mL | Freq: Once | INTRAVENOUS | Status: AC
  Administered 2022-07-02: 1000 mL via INTRAVENOUS

## 2022-07-02 MED ORDER — KETOROLAC TROMETHAMINE 15 MG/ML IJ SOLN
15.0000 mg | Freq: Once | INTRAMUSCULAR | Status: AC
  Administered 2022-07-02: 15 mg via INTRAVENOUS
  Filled 2022-07-02: qty 1

## 2022-07-02 MED ORDER — HYDRALAZINE HCL 20 MG/ML IJ SOLN
5.0000 mg | Freq: Once | INTRAMUSCULAR | Status: AC
  Administered 2022-07-02: 5 mg via INTRAVENOUS
  Filled 2022-07-02: qty 1

## 2022-07-02 MED ORDER — DIPHENHYDRAMINE HCL 50 MG/ML IJ SOLN
12.5000 mg | Freq: Once | INTRAMUSCULAR | Status: DC
  Administered 2022-07-02: 12.5 mg via INTRAVENOUS
  Filled 2022-07-02: qty 1

## 2022-07-02 MED ORDER — METOCLOPRAMIDE HCL 5 MG/ML IJ SOLN
10.0000 mg | Freq: Once | INTRAMUSCULAR | Status: DC
  Administered 2022-07-02: 10 mg via INTRAVENOUS
  Filled 2022-07-02: qty 2

## 2022-07-02 MED ORDER — AMLODIPINE BESYLATE 5 MG PO TABS: 5.0000 mg | ORAL_TABLET | Freq: Every day | ORAL | 0 refills | Status: AC

## 2022-07-02 MED ORDER — MAGNESIUM SULFATE 2 GM/50ML IV SOLN
2.0000 g | Freq: Once | INTRAVENOUS | Status: AC
  Administered 2022-07-02: 2 g via INTRAVENOUS
  Filled 2022-07-02: qty 50

## 2022-07-02 NOTE — Discharge Instructions (Signed)
You are seen emergency department today for headache and found to have significantly elevated blood pressure.  I am glad that you are feeling much better, and your blood pressures come down well. I'm starting you on a low dose blood pressure medication called Amlodipine.  Take this once daily.  It is incredibly important that you follow-up with your primary doctor about your ER visit today, possibly discuss long-term management of your blood pressure.  Continue to monitor how you're doing and return to the ER for new or worsening symptoms.

## 2022-07-02 NOTE — ED Provider Notes (Signed)
[facility]-EMERGENCY DEPT Provider Note   CSN: [id number] Arrival date & time: 07/02/22  1431     History  Chief Complaint  Patient presents with   Headache    [name] is a 61 y.o. female.  With a history of hypertension presents to the ED for evaluation of headache.  States she woke up approximately 1 AM this morning with a headache involving the right side of her head and behind the right eye.  Was able to fall back asleep after this, but reported persistent and increasing pain throughout the day.  Had to leave work due to the pain.  Has not taken any medications at home for the pain.  Typically does not get headaches.  Has never had a headache like this.  Describes it as a mix of aching and throbbing.  Rates it at an 8 out of 10 currently.  States that is getting better since when she initially presented.  Had a history of high blood pressure, but was taken off of antihypertensives in 2019 and because she had normal pressures.  States that her blood pressures go up and down.  Denies dizziness, lightheadedness, blurry vision or vision changes, numbness, weakness, tingling, chest pain, shortness of breath, abdominal pain, nausea, vomiting.   Headache      Home Medications Prior to Admission medications   Medication Sig Start Date End Date Taking? Authorizing Provider  ibuprofen (ADVIL,MOTRIN) 400 MG tablet Take 1 tablet (400 mg total) by mouth every 6 (six) hours as needed. 03/18/18   Couture, Cortni S, PA-C      Allergies    Patient has no known allergies.    Review of Systems   Review of Systems  Neurological:  Positive for headaches.  All other systems reviewed and are negative.   Physical Exam Updated Vital Signs BP (!) 216/101   Pulse (!) 49   Temp 98.4 F (36.9 C) (Oral)   Resp 18   SpO2 100%  Physical Exam Vitals and nursing note reviewed.  Constitutional:      General: She is not in acute distress.    Appearance: She is  well-developed. She is obese. She is not ill-appearing, toxic-appearing or diaphoretic.  HENT:     Head: Normocephalic and atraumatic.     Mouth/Throat:     Mouth: Mucous membranes are moist.     Pharynx: Oropharynx is clear.  Eyes:     Extraocular Movements: Extraocular movements intact.     Conjunctiva/sclera: Conjunctivae normal.     Pupils: Pupils are equal, round, and reactive to light.     Comments: No nystagmus  Neck:     Comments: No cervical tenderness to palpation Cardiovascular:     Rate and Rhythm: Normal rate and regular rhythm.     Heart sounds: Normal heart sounds. No murmur heard. Pulmonary:     Effort: Pulmonary effort is normal. No respiratory distress.     Breath sounds: Normal breath sounds. No stridor. No wheezing or rhonchi.  Abdominal:     Palpations: Abdomen is soft.     Tenderness: There is no abdominal tenderness.  Musculoskeletal:        General: No swelling. Normal range of motion.     Cervical back: Normal range of motion and neck supple.  Skin:    General: Skin is warm and dry.     Capillary Refill: Capillary refill takes less than 2 seconds.  Neurological:     Mental Status: She is alert  and oriented to person, place, and time.     GCS: GCS eye subscore is 4. GCS verbal subscore is 5. GCS motor subscore is 6.     Comments: No slurred speech, facial asymmetry, pronator drift, unilateral or global weakness.  Normal heel-to-shin and finger-to-nose and shoulder shrug.  Sensation intact in all extremities.  Psychiatric:        Mood and Affect: Mood normal.     ED Results / Procedures / Treatments   Labs (all labs ordered are listed, but only abnormal results are displayed) Labs Reviewed  COMPREHENSIVE METABOLIC PANEL - Abnormal; Notable for the following components:      Result Value   Creatinine, Ser 1.05 (*)    All other components within normal limits  CBC WITH DIFFERENTIAL/PLATELET    EKG None  Radiology CT Head Wo Contrast  Result  Date: 07/02/2022 CLINICAL DATA:  Headache EXAM: CT HEAD WITHOUT CONTRAST TECHNIQUE: Contiguous axial images were obtained from the base of the skull through the vertex without intravenous contrast. RADIATION DOSE REDUCTION: This exam was performed according to the departmental dose-optimization program which includes automated exposure control, adjustment of the mA and/or kV according to patient size and/or use of iterative reconstruction technique. COMPARISON:  None Available. FINDINGS: Brain: No evidence of acute infarction, hemorrhage, hydrocephalus, extra-axial collection or mass lesion/mass effect. Vascular: No hyperdense vessel or unexpected calcification. Skull: Normal. Negative for fracture or focal lesion. Sinuses/Orbits: No acute finding. Other: None. IMPRESSION: No acute intracranial abnormality. Electronically Signed   By: Darliss Cheney M.D.   On: 07/02/2022 15:38    Procedures Procedures    Medications Ordered in ED Medications  magnesium sulfate IVPB 2 g 50 mL (has no administration in time range)  sodium chloride 0.9 % bolus 1,000 mL (1,000 mLs Intravenous New Bag/Given 07/02/22 2108)  ketorolac (TORADOL) 15 MG/ML injection 15 mg (15 mg Intravenous Given 07/02/22 2108)  hydrALAZINE (APRESOLINE) injection 5 mg (5 mg Intravenous Given 07/02/22 2117)    ED Course/ Medical Decision Making/ A&P Clinical Course as of 07/02/22 2213  Mon Jul 02, 2022  2127 Reglan was ordered and canceled immediately when EKG showed prolonged QT. Was given anyway. Will monitor EKG and give magnesium [AS]    Clinical Course User Index [AS] Lula Olszewski Edsel Petrin, PA-C                           Medical Decision Making Risk Prescription drug management.  This patient presents to the ED for concern of headache, this involves an extensive number of treatment options, and is a complaint that carries with it a high risk of complications and morbidity. Emergent considerations for headache include subarachnoid  hemorrhage, meningitis, temporal arteritis, glaucoma, cerebral ischemia, carotid/vertebral dissection, intracranial tumor, Venous sinus thrombosis, carbon monoxide poisoning, acute or chronic subdural hemorrhage.  Other considerations include: Migraine, Cluster headache, Hypertension, Caffeine, alcohol, or drug withdrawal, Pseudotumor cerebri, Arteriovenous malformation, Head injury, Neurocysticercosis, Post-lumbar puncture, Preeclampsia, Tension headache, Sinusitis, Cervical arthritis, Refractive error causing strain, Dental abscess, Otitis media, Temporomandibular joint syndrome, Depression, Somatoform disorder (eg, somatization) Trigeminal neuralgia, Glossopharyngeal neuralgia.    My initial workup includes basic labs, CT head, headache cocktail, IV hydralazine  Additional history obtained from: Nursing notes from this visit.  I ordered, reviewed and interpreted labs which include: BMP, CBC. normal   I ordered imaging studies including CT head I independently visualized and interpreted imaging which showed normal I agree with the radiologist interpretation  Afebrile,  hypertensive with systolic over 1.  61 year old female presenting to the ED for evaluation of headache.  Physical exam completely unremarkable.  There are no focal neurologic deficits.  Lab work unremarkable.  CT head unremarkable.  Patient was given headache cocktail in the ED.  She did have QT prolongation with a QTc of 579.  Reglan was ordered, but was immediately discontinued.  Was given in the ED anyway.  Repeat EKG was placed and patient was given IV magnesium. Final disposition will be pending Blood pressure recheck and repeat EKG.  I suspect her blood pressure and symptoms to be improved and she will be ready for discharge with close outpatient follow-up. I have low suspicion for acute intracranial abnormalities as the cause of her symptoms and there are no signs of end organ dysfunction from hypertension. Care will be handed  off to [employer], PA-C pending reevaluation. Current plan may change at the discretion of the oncoming provider. Please see their note for final disposition.   Note: Portions of this report may have been transcribed using voice recognition software. Every effort was made to ensure accuracy; however, inadvertent computerized transcription errors may still be present.          Final Clinical Impression(s) / ED Diagnoses Final diagnoses:  None    Rx / DC Orders ED Discharge Orders     None         Mora Bellman 07/02/22 2213    Ernie Avena, MD 07/03/22 [phone]

## 2022-07-02 NOTE — ED Provider Triage Note (Signed)
Emergency Medicine Provider Triage Evaluation Note  [name] , a 61 y.o. female  was evaluated in triage.  Pt complains of headache behind right eye radiating down right side of posterior neck.  Headache started this morning.  She said it first started gradual but then got a lot worse.  She denies any vision changes, nausea, vomiting, numbness, weakness, speech change.  She is no longer on blood pressure medications as her doctor took her off a while back..  Review of Systems  Positive:  Negative:   Physical Exam  BP (!) 243/96 (BP Location: Right Arm)   Pulse 68   Temp 98 F (36.7 C) (Oral)   Resp 18   SpO2 100%  Gen:   Awake, no distress   Resp:  Normal effort  MSK:   Moves extremities without difficulty  Other:  Pupils are round equal and reactive.  EOMs are intact. No focal deficits   Medical Decision Making  Medically screening exam initiated at 2:53 PM.  Appropriate orders placed.  [name] was informed that the remainder of the evaluation will be completed by another provider, this initial triage assessment does not replace that evaluation, and the importance of remaining in the ED until their evaluation is complete.     Claudie Leach, PA-C 07/02/22 1454

## 2022-07-02 NOTE — ED Triage Notes (Addendum)
Patient has had a headache since this morning. She said she couldn't bend down and pick stuff up. She went to urgent care but the wait time was 4 hours and she came here. Her PCP cannot see her until tomorrow. Headache is behind her right eye and down her neck. No history of high blood pressure. No blurred vision or seeing spots.

## 2022-07-02 NOTE — ED Provider Notes (Signed)
  Physical Exam  BP (!) 216/101   Pulse (!) 49   Temp 98.4 F (36.9 C) (Oral)   Resp 18   SpO2 100%   Physical Exam Vitals and nursing note reviewed.  Constitutional:      Appearance: Normal appearance.  HENT:     Head: Normocephalic and atraumatic.  Eyes:     Conjunctiva/sclera: Conjunctivae normal.  Pulmonary:     Effort: Pulmonary effort is normal. No respiratory distress.  Skin:    General: Skin is warm and dry.  Neurological:     Mental Status: She is alert.  Psychiatric:        Mood and Affect: Mood normal.        Behavior: Behavior normal.    ED Course / MDM   Clinical Course as of 07/02/22 2200  Mon Jul 02, 2022  2127 Reglan was ordered and canceled immediately when EKG showed prolonged QT. Was given anyway. Will monitor EKG and give magnesium [AS]    Clinical Course User Index [AS] Lula Olszewski Edsel Petrin, PA-C   Medical Decision Making Risk Prescription drug management.  Accepted handoff at shift change from [facility]. Please see prior provider note for full HPI.  Briefly: Patient is a 61 year old female who presents to the ER for headache and elevated blood pressure.  DDX/Plan: Plan to reevaluate after migraine cocktail and ensure headache has improved and blood pressure has come down, goal systolic < 185.   After consideration of the diagnostic results and the patients response to treatment, I feel that emergency department workup does not suggest an emergent condition requiring admission or immediate intervention beyond what has been performed at this time. On reevaluation, patient reports feeling much better. Headache has resolved and repeat blood pressure was significantly improved. Will start patient on daily 5mg  amlodipine until she can follow up with her PCP. The patient is safe for discharge and has been instructed to return immediately for worsening symptoms, change in symptoms or any other concerns.   07/02/22 2233     2234, MD 07/03/22 [phone]

## 2022-07-23 DIAGNOSIS — Z419 Encounter for procedure for purposes other than remedying health state, unspecified: Secondary | ICD-10-CM | POA: Diagnosis not present

## 2022-08-23 DIAGNOSIS — Z419 Encounter for procedure for purposes other than remedying health state, unspecified: Secondary | ICD-10-CM | POA: Diagnosis not present

## 2022-08-27 DIAGNOSIS — H43811 Vitreous degeneration, right eye: Secondary | ICD-10-CM | POA: Diagnosis not present

## 2022-08-27 DIAGNOSIS — H401131 Primary open-angle glaucoma, bilateral, mild stage: Secondary | ICD-10-CM | POA: Diagnosis not present

## 2022-08-27 DIAGNOSIS — H2513 Age-related nuclear cataract, bilateral: Secondary | ICD-10-CM | POA: Diagnosis not present

## 2022-08-28 DIAGNOSIS — H5213 Myopia, bilateral: Secondary | ICD-10-CM | POA: Diagnosis not present

## 2022-09-21 DIAGNOSIS — Z419 Encounter for procedure for purposes other than remedying health state, unspecified: Secondary | ICD-10-CM | POA: Diagnosis not present

## 2022-10-10 DIAGNOSIS — H524 Presbyopia: Secondary | ICD-10-CM | POA: Diagnosis not present

## 2022-10-22 DIAGNOSIS — Z419 Encounter for procedure for purposes other than remedying health state, unspecified: Secondary | ICD-10-CM | POA: Diagnosis not present

## 2022-11-06 DIAGNOSIS — J069 Acute upper respiratory infection, unspecified: Secondary | ICD-10-CM | POA: Diagnosis not present

## 2022-11-21 DIAGNOSIS — Z419 Encounter for procedure for purposes other than remedying health state, unspecified: Secondary | ICD-10-CM | POA: Diagnosis not present

## 2022-12-22 DIAGNOSIS — Z419 Encounter for procedure for purposes other than remedying health state, unspecified: Secondary | ICD-10-CM | POA: Diagnosis not present

## 2023-01-21 DIAGNOSIS — Z419 Encounter for procedure for purposes other than remedying health state, unspecified: Secondary | ICD-10-CM | POA: Diagnosis not present

## 2023-02-21 DIAGNOSIS — Z419 Encounter for procedure for purposes other than remedying health state, unspecified: Secondary | ICD-10-CM | POA: Diagnosis not present

## 2023-02-27 DIAGNOSIS — H2513 Age-related nuclear cataract, bilateral: Secondary | ICD-10-CM | POA: Diagnosis not present

## 2023-02-27 DIAGNOSIS — H33321 Round hole, right eye: Secondary | ICD-10-CM | POA: Diagnosis not present

## 2023-02-27 DIAGNOSIS — H43811 Vitreous degeneration, right eye: Secondary | ICD-10-CM | POA: Diagnosis not present

## 2023-02-27 DIAGNOSIS — H401131 Primary open-angle glaucoma, bilateral, mild stage: Secondary | ICD-10-CM | POA: Diagnosis not present

## 2023-02-27 DIAGNOSIS — H0012 Chalazion right lower eyelid: Secondary | ICD-10-CM | POA: Diagnosis not present

## 2023-03-24 DIAGNOSIS — Z419 Encounter for procedure for purposes other than remedying health state, unspecified: Secondary | ICD-10-CM | POA: Diagnosis not present

## 2023-05-06 DIAGNOSIS — R001 Bradycardia, unspecified: Secondary | ICD-10-CM | POA: Diagnosis not present

## 2023-05-06 DIAGNOSIS — Z1329 Encounter for screening for other suspected endocrine disorder: Secondary | ICD-10-CM | POA: Diagnosis not present

## 2023-05-06 DIAGNOSIS — I10 Essential (primary) hypertension: Secondary | ICD-10-CM | POA: Diagnosis not present

## 2023-05-06 DIAGNOSIS — Z131 Encounter for screening for diabetes mellitus: Secondary | ICD-10-CM | POA: Diagnosis not present

## 2023-05-06 DIAGNOSIS — Z0001 Encounter for general adult medical examination with abnormal findings: Secondary | ICD-10-CM | POA: Diagnosis not present

## 2023-05-06 DIAGNOSIS — Z136 Encounter for screening for cardiovascular disorders: Secondary | ICD-10-CM | POA: Diagnosis not present

## 2023-05-12 NOTE — Progress Notes (Addendum)
  [facility] Visit Note    [name] is a 62 y.o. female who presents with smoking cessation.   History obtained from patient.   Assessment & Plan:    Assessment & Plan Encounter for smoking cessation counseling  Orders: .  Smoking Cessation Initial >10 Minutes .  nicotine polacrilex (COMMIT) 2 MG lozenge; Weeks 1-6: dissolve 1 lozenge in mouth every 1-2 hours. Weeks 7-9: 1 lozenge every 2-4 hours. Weeks 10-12: 1 lozenge every 4-8 hours. Tobacco user. Cessation readiness evaluated and quit strategies discussed. Encouraged patient to return to [facility] or follow up with PCP for additional evaluation and support.  Cigarette smoker motivated to quit     Primary hypertension Hypertension is improving with treatment. Continue current treatment regimen. Blood pressure will be reassessed in 4 weeks. -Pt has apt with PCP on 11/11 for HTN follow up -Alarm symptoms discussed and pt states understanding of proper follow up if she starts to have headache, chest pain, SOB, dizziness, etc      Return in about 1 month (around 06/12/2023) for PCP.  Subjective     Tobacco Cessation Initial Tobacco type:  Cigarettes Urge triggers: meal time and other   Urge triggers comment:  After work Number of cigarettes per day:  1 Smokeless tobacco per week:  None Age when first started tobacco use:  22 Treatments tried:  Cold turkey Number of previous quit attempts:  2 How long ago was your last quit attempt?:  9 years Reason(s) for quitting: health      No Known Allergies  Review of Systems  All other systems reviewed and are negative.    Objective     Vital Signs: BP (!) 142/90 (Site: Right arm, Position: Sitting, Cuff Size: Adult)   Pulse 62   Temp 97.8 F (36.6 C) (Tympanic)   Resp 18   Ht 5' 6 (1.676 m)   Wt 169 lb (76.7 kg)   SpO2 100%   BMI 27.28 kg/m    Physical Exam Constitutional:      Appearance: Normal appearance. She is normal weight.  HENT:     Head:  Normocephalic and atraumatic.  Pulmonary:     Effort: Pulmonary effort is normal.  Musculoskeletal:        General: Normal range of motion.     Cervical back: Normal range of motion.  Skin:    General: Skin is warm and dry.  Neurological:     General: No focal deficit present.     Mental Status: She is alert and oriented to person, place, and time. Mental status is at baseline.  Psychiatric:        Mood and Affect: Mood normal.        Behavior: Behavior normal.        Thought Content: Thought content normal.        Judgment: Judgment normal.        I attest that I spent more than 10 minutes on behavioral modification counseling for smoking and tobacco use cessation.

## 2023-12-25 ENCOUNTER — Other Ambulatory Visit: Payer: Self-pay | Admitting: Nephrology

## 2023-12-25 ENCOUNTER — Ambulatory Visit
Admission: RE | Admit: 2023-12-25 | Discharge: 2023-12-25 | Disposition: A | Discharge status: Home / Self Care | Source: Ambulatory Visit | Attending: Nephrology | Admitting: Nephrology

## 2023-12-25 DIAGNOSIS — N1832 Chronic kidney disease, stage 3b: Secondary | ICD-10-CM

## 2024-06-10 ENCOUNTER — Other Ambulatory Visit: Payer: Self-pay

## 2024-06-10 ENCOUNTER — Emergency Department (HOSPITAL_COMMUNITY)
Admission: EM | Admit: 2024-06-10 | Discharge: 2024-06-10 | Disposition: A | Discharge status: Home / Self Care | Attending: Emergency Medicine | Admitting: Emergency Medicine

## 2024-06-10 ENCOUNTER — Emergency Department (HOSPITAL_COMMUNITY)

## 2024-06-10 DIAGNOSIS — M25571 Pain in right ankle and joints of right foot: Secondary | ICD-10-CM | POA: Insufficient documentation

## 2024-06-10 DIAGNOSIS — N183 Chronic kidney disease, stage 3 unspecified: Secondary | ICD-10-CM | POA: Insufficient documentation

## 2024-06-10 MED ORDER — HYDROCODONE-ACETAMINOPHEN 5-325 MG PO TABS: 1.0000 | ORAL_TABLET | ORAL | 0 refills | Status: AC | PRN

## 2024-06-10 MED ORDER — PREDNISONE 50 MG PO TABS: ORAL_TABLET | ORAL | 0 refills | Status: AC

## 2024-06-10 MED ORDER — KETOROLAC TROMETHAMINE 15 MG/ML IJ SOLN
15.0000 mg | Freq: Once | INTRAMUSCULAR | Status: AC
  Administered 2024-06-10: 15 mg via INTRAMUSCULAR
  Filled 2024-06-10: qty 1

## 2024-06-10 NOTE — ED Triage Notes (Signed)
 Patient arrives POV for right ankle pain and swelling x 1 month. Denies injury. Patient denies using crutches or cane. Patient endorses 5/10 pain, worse with weight bearing activities.

## 2024-06-10 NOTE — ED Provider Triage Note (Signed)
 Emergency Medicine Provider Triage Evaluation Note  [name] , a 63 y.o. female  was evaluated in triage.  Pt complains of right ankle pain.  Denies obvious injury.  Pain with bearing weights.  Much worse last night.  Improved a bit this morning.  No fevers..  Review of Systems  Positive: Right ankle pain Negative: Fevers trauma  Physical Exam  BP 132/81 (BP Location: Left Arm)   Pulse 75   Temp 98.2 F (36.8 C)   Resp 17   Ht 5' 3 (1.6 m)   Wt 79.8 kg   SpO2 100%   BMI 31.16 kg/m  Gen:   Awake, no distress   Resp:  Normal effort  MSK:   Moves extremities without difficulty pulse motor and sensation are intact to the right lower extremity.  I am able to range the ankle without significant discomfort.  Ankle is mildly warm no erythema.  Pain mostly along the anterior tibiofibular ligament Other:    Medical Decision Making  Medically screening exam initiated at 7:57 AM.  Appropriate orders placed.  [name] was informed that the remainder of the evaluation will be completed by another provider, this initial triage assessment does not replace that evaluation, and the importance of remaining in the ED until their evaluation is complete.  63 year old female with right ankle pain.  Seems less likely to be gout or septic arthritis.  Obtain a plain film.     Emil Share, DO 06/10/24 [phone]

## 2024-06-10 NOTE — ED Provider Notes (Signed)
  EMERGENCY DEPARTMENT AT [facility] Provider Note   CSN: [id number] Arrival date & time: 06/10/24  9252     Patient presents with: Ankle Pain   [name] is a 63 y.o. female.   Patient complains of right ankle pain.  She reports that her ankle has been swelling for the past month.  She had increased discomfort last p.m.  Patient states she had difficulty sleeping because her ankle was throbbing.  Patient denies having any injury.  She denies any redness she has not experienced any heat.  Patient states that she has not had gout in the past.  Patient denies any medical problems however she does appear to have a history of stage III kidney disease.  Patient reports over-the-counter medications have not been working for her discomfort.  Patient states the pain has been causing her to limp  The history is provided by the patient and a relative. No language interpreter was used.  Ankle Pain      Prior to Admission medications   Medication Sig Start Date End Date Taking? Authorizing Provider  HYDROcodone -acetaminophen  (NORCO/VICODIN) 5-325 MG tablet Take 1 tablet by mouth every 4 (four) hours as needed. 06/10/24 06/10/25 Yes Tammey Deeg K, PA-C  predniSONE (DELTASONE) 50 MG tablet One tablet a day 06/10/24  Yes Kaelani Kendrick K, PA-C  amLODipine  (NORVASC ) 5 MG tablet Take 1 tablet (5 mg total) by mouth daily. 07/02/22   Roemhildt, Lorin T, PA-C  ibuprofen  (ADVIL ,MOTRIN ) 400 MG tablet Take 1 tablet (400 mg total) by mouth every 6 (six) hours as needed. 03/18/18   Couture, Cortni S, PA-C    Allergies: Patient has no known allergies.    Review of Systems  All other systems reviewed and are negative.   Updated Vital Signs BP 132/81 (BP Location: Left Arm)   Pulse 75   Temp 98.2 F (36.8 C)   Resp 17   Ht 5' 3 (1.6 m)   Wt 79.8 kg   SpO2 100%   BMI 31.16 kg/m   Physical Exam Vitals and nursing note reviewed.  Constitutional:      Appearance: She is  well-developed.  HENT:     Head: Normocephalic.  Cardiovascular:     Rate and Rhythm: Normal rate.  Pulmonary:     Effort: Pulmonary effort is normal.  Abdominal:     General: There is no distension.  Musculoskeletal:        General: Swelling and tenderness present. Normal range of motion.     Comments: Tender swollen right ankle, no deformity, full range of motion neurovascular neurosensory intact  Skin:    General: Skin is warm.  Neurological:     General: No focal deficit present.     Mental Status: She is alert and oriented to person, place, and time.     (all labs ordered are listed, but only abnormal results are displayed) Labs Reviewed - No data to display  EKG: None  Radiology: DG Ankle Complete Right Result Date: 06/10/2024 EXAM: 3 OR MORE VIEW(S) XRAY OF THE RIGHT ANKLE 06/10/2024 08:50:00 AM CLINICAL HISTORY: ankle pain COMPARISON: 04/08/2013 FINDINGS: BONES AND JOINTS: No acute fracture. No focal osseous lesion. No joint dislocation. SOFT TISSUES: Mild soft tissue swelling is seen overlying the lateral malleolus. IMPRESSION: 1. No acute osseous abnormality. 2. Mild soft tissue swelling overlying the lateral malleolus. Electronically signed by: Lynwood Seip MD 06/10/2024 09:06 AM EST RP Workstation: [id number]     Procedures   Medications Ordered  in the ED  ketorolac  (TORADOL ) 15 MG/ML injection 15 mg (15 mg Intramuscular Given 06/10/24 0805)                                    Medical Decision Making Patient complains of pain in her right ankle she has had no injury swelling and pain have gotten worse over the last month  Amount and/or Complexity of Data Reviewed Radiology: ordered and independent interpretation performed. Decision-making details documented in ED Course.    Details: X-ray right ankle shows soft tissue swelling no acute findings  Risk Prescription drug management. Risk Details: Patient is given a prescription for prednisone and hydrocodone .   Patient is given phone number for Dr. Fidel orthopedist on-call to follow-up with.  Patient is advised to elevate foot.  Patient is given a note to be out of work for the next 2 days.  Patient declined crutches she will get a cane to help her ambulate.  Patient is discharged in stable condition        Final diagnoses:  Acute right ankle pain    ED Discharge Orders          Ordered    predniSONE (DELTASONE) 50 MG tablet        06/10/24 1018    HYDROcodone -acetaminophen  (NORCO/VICODIN) 5-325 MG tablet  Every 4 hours PRN        06/10/24 1018           An After Visit Summary was printed and given to the patient.     Flint Sonny POUR, PA-C 06/10/24 1029    Rogelia Jerilynn RAMAN, MD 06/11/24 [phone]
